# Patient Record
Sex: Male | Born: 1990 | Race: Black or African American | Hispanic: No | Marital: Married | Smoking: Never smoker
Health system: Southern US, Community
[De-identification: ages and names within clinical notes are randomized; demographics above are authoritative.]

## PROBLEM LIST (undated history)

## (undated) DIAGNOSIS — R011 Cardiac murmur, unspecified: Secondary | ICD-10-CM

---

## 2009-09-26 ENCOUNTER — Emergency Department (HOSPITAL_COMMUNITY): Admission: EM | Admit: 2009-09-26 | Discharge: 2009-09-26 | Payer: Self-pay | Admitting: Emergency Medicine

## 2011-10-15 ENCOUNTER — Emergency Department (HOSPITAL_COMMUNITY)
Admission: EM | Admit: 2011-10-15 | Discharge: 2011-10-15 | Disposition: A | Discharge status: Home / Self Care | Attending: Emergency Medicine | Admitting: Emergency Medicine

## 2011-10-15 ENCOUNTER — Encounter: Payer: Self-pay | Admitting: Emergency Medicine

## 2011-10-15 DIAGNOSIS — Z79899 Other long term (current) drug therapy: Secondary | ICD-10-CM | POA: Insufficient documentation

## 2011-10-15 DIAGNOSIS — R51 Headache: Secondary | ICD-10-CM

## 2011-10-15 DIAGNOSIS — H53149 Visual discomfort, unspecified: Secondary | ICD-10-CM | POA: Insufficient documentation

## 2011-10-15 DIAGNOSIS — R42 Dizziness and giddiness: Secondary | ICD-10-CM | POA: Insufficient documentation

## 2011-10-15 MED ORDER — METOCLOPRAMIDE HCL 5 MG/ML IJ SOLN
10.0000 mg | Freq: Once | INTRAMUSCULAR | Status: AC
  Administered 2011-10-15: 10 mg via INTRAVENOUS
  Filled 2011-10-15: qty 2

## 2011-10-15 MED ORDER — IBUPROFEN 800 MG PO TABS: 800.0000 mg | ORAL_TABLET | Freq: Three times a day (TID) | ORAL | Status: AC

## 2011-10-15 NOTE — ED Provider Notes (Signed)
Evaluation and management procedures were performed by the PA/NP under my supervision/collaboration.   Rakeisha Nyce, MD 10/15/11 1526 

## 2011-10-15 NOTE — ED Notes (Signed)
Pt here c/o generalized HA x 1 week; pt sts constipation on Monday and Tuesday and diarrhea the rest of the days; pt sts some dizziness with position change but denies N/V

## 2011-10-15 NOTE — ED Provider Notes (Signed)
History     CSN: [id number] Arrival date & time: 10/15/2011 11:01 AM   First MD Initiated Contact with Patient 10/15/11 1158      Chief Complaint  Patient presents with  . Headache    (Consider location/radiation/quality/duration/timing/severity/associated sxs/prior treatment) HPI History provided by pt.   Pt presents w/ waxing/waning, frontal headache since Monday.  Characteristic of pain change throughout day.  Associated w/ photophobia at night, subjective fever and sometimes  w/ dizziness and loss of balance.  Denies vision changes and N/V.  Denies head trauma.  Had a similar headache when he was in high school.   History reviewed. No pertinent past medical history.  History reviewed. No pertinent past surgical history.  History reviewed. No pertinent family history.  History  Substance Use Topics  . Smoking status: Never Smoker   . Smokeless tobacco: Not on file  . Alcohol Use: No      Review of Systems  All other systems reviewed and are negative.    Allergies  Review of patient's allergies indicates no known allergies.  Home Medications   Current Outpatient Rx  Name Route Sig Dispense Refill  . BISMUTH SUBSALICYLATE 262 MG PO CHEW Oral Chew 524 mg by mouth daily as needed. For upset stomach     . LORATADINE 10 MG PO TABS Oral Take 10 mg by mouth daily.      . MOMETASONE FUROATE 50 MCG/ACT NA SUSP Nasal Place 1 spray into the nose daily.      Carma Leaven M PLUS PO TABS Oral Take 1 tablet by mouth daily.        BP 115/78  Pulse 56  Temp(Src) 98.1 F (36.7 C) (Oral)  Resp 16  SpO2 97%  Physical Exam  Nursing note and vitals reviewed. Constitutional: He is oriented to person, place, and time. He appears well-developed and well-nourished. No distress.  HENT:  Head: Normocephalic and atraumatic.       No sinus tenderness  Eyes:       Normal appearance  Neck: Normal range of motion.       No meningeal signs  Cardiovascular: Normal rate, regular rhythm  and intact distal pulses.   Pulmonary/Chest: Effort normal and breath sounds normal.  Musculoskeletal: Normal range of motion.  Neurological: He is alert and oriented to person, place, and time. He has normal reflexes. He displays no tremor. No cranial nerve deficit or sensory deficit. He displays a negative Romberg sign. Coordination and gait normal.       Nml sensation.  5/5 and equal upper and lower extremity strength.  No past pointing.    Skin: Skin is warm and dry. No rash noted.  Psychiatric: He has a normal mood and affect. His behavior is normal.    ED Course  Procedures (including critical care time)  Labs Reviewed - No data to display No results found.   1. Headache       MDM  Healthy Irena Reichmann M pt presents w/ non-traumatic headache x 4-5 days.  No fever, meningeal signs or focal neuro deficits on exam.  Pt does not appear uncomfortable.  Receiving IV fluids now and IV reglan has been ordered.  Will reassess shortly.  12:36 PM   Pt reports that pain has improved from 4/10 to 2/10.  He feels well enough to go home.  Prescribed 800mg  ibuprofen.  Recommended that he buy a thermometer. Strict return precautions discussed.         Otilio Miu, PA 10/15/11  1325 

## 2013-07-15 ENCOUNTER — Emergency Department (HOSPITAL_COMMUNITY)
Admission: EM | Admit: 2013-07-15 | Discharge: 2013-07-15 | Disposition: A | Discharge status: Home / Self Care | Attending: Emergency Medicine | Admitting: Emergency Medicine

## 2013-07-15 DIAGNOSIS — Z23 Encounter for immunization: Secondary | ICD-10-CM | POA: Insufficient documentation

## 2013-07-15 DIAGNOSIS — S01501A Unspecified open wound of lip, initial encounter: Secondary | ICD-10-CM | POA: Insufficient documentation

## 2013-07-15 DIAGNOSIS — Z79899 Other long term (current) drug therapy: Secondary | ICD-10-CM | POA: Insufficient documentation

## 2013-07-15 DIAGNOSIS — Y9351 Activity, roller skating (inline) and skateboarding: Secondary | ICD-10-CM | POA: Insufficient documentation

## 2013-07-15 DIAGNOSIS — S01511A Laceration without foreign body of lip, initial encounter: Secondary | ICD-10-CM

## 2013-07-15 DIAGNOSIS — IMO0002 Reserved for concepts with insufficient information to code with codable children: Secondary | ICD-10-CM | POA: Insufficient documentation

## 2013-07-15 DIAGNOSIS — Y9239 Other specified sports and athletic area as the place of occurrence of the external cause: Secondary | ICD-10-CM | POA: Insufficient documentation

## 2013-07-15 MED ORDER — TETANUS-DIPHTHERIA TOXOIDS TD 5-2 LFU IM INJ
0.5000 mL | INJECTION | Freq: Once | INTRAMUSCULAR | Status: AC
  Administered 2013-07-15: 0.5 mL via INTRAMUSCULAR
  Filled 2013-07-15: qty 0.5

## 2013-07-15 MED ORDER — CYCLOBENZAPRINE HCL 10 MG PO TABS: 10.0000 mg | ORAL_TABLET | Freq: Two times a day (BID) | ORAL | Status: DC | PRN

## 2013-07-15 MED ORDER — IBUPROFEN 800 MG PO TABS: 800.0000 mg | ORAL_TABLET | Freq: Three times a day (TID) | ORAL | Status: DC

## 2013-07-15 NOTE — ED Notes (Signed)
Pt was riding skateboard, and busted Lower lip.

## 2013-07-15 NOTE — ED Notes (Signed)
Pt states he was riding a skate board and fell and busted his lip. Laceration noted to lower lip.

## 2013-07-15 NOTE — ED Provider Notes (Signed)
CSN: [id number]     Arrival date & time 07/15/13  2115 History  This chart was scribed for non-physician practitioner Elpidio Anis, PA-C, working with Vida Roller, MD, by Yevette Edwards, ED Scribe. This patient was seen in room TR06C/TR06C and the patient's care was started at 9:24 PM.   First MD Initiated Contact with Patient 07/15/13 2123     Chief Complaint  Patient presents with  . Lip Laceration    The history is provided by the patient. No language interpreter was used.   HPI Comments: Jason Finley is a 22 y.o. male who presents to the Emergency Department complaining of an acute laceration to his lower lip which occurred four hours ago today when he was in a skateboarding accident. The laceration is bleeding minimally. He denies any loose teeth. The pt reports he has abrasions to his knees and right elbow. He does not recall his last tetanus.   No past medical history on file. No past surgical history on file. No family history on file. History  Substance Use Topics  . Smoking status: Never Smoker   . Smokeless tobacco: Not on file  . Alcohol Use: No    Review of Systems  Constitutional: Negative for fever and chills.  Skin: Positive for wound.  All other systems reviewed and are negative.    Allergies  Review of patient's allergies indicates no known allergies.  Home Medications   Current Outpatient Rx  Name  Route  Sig  Dispense  Refill  . loratadine (CLARITIN) 10 MG tablet   Oral   Take 10 mg by mouth daily.           . mometasone (NASONEX) 50 MCG/ACT nasal spray   Nasal   Place 1 spray into the nose daily.            Triage Vitals: BP 121/67  Pulse 73  Temp(Src) 98.7 F (37.1 C) (Oral)  Resp 20  SpO2 98%  Physical Exam  Nursing note and vitals reviewed. Constitutional: He is oriented to person, place, and time. He appears well-developed and well-nourished. No distress.  HENT:  Head: Normocephalic.  Stable dentition. 1 cm laceration to  lower lip. Moderate gapping. Minimal swelling.   Eyes: EOM are normal.  Neck: Neck supple. No tracheal deviation present.  Cardiovascular: Normal rate.   Pulmonary/Chest: Effort normal. No respiratory distress.  Musculoskeletal: Normal range of motion.  Neurological: He is alert and oriented to person, place, and time.  Skin: Skin is warm and dry.  Psychiatric: He has a normal mood and affect. His behavior is normal.    ED Course  Procedures (including critical care time)  DIAGNOSTIC STUDIES:  Oxygen Saturation is 98% on room air, normal by my interpretation.    COORDINATION OF CARE:  9:32 PM- Discussed treatment plan with patient, and the patient agreed to the plan.   LACERATION REPAIR PROCEDURE NOTE The patient's identification was confirmed and consent was obtained. This procedure was performed by Elpidio Anis, Pa-C, at 9:42 PM. Site: Lower lip Sterile procedures observed Anesthetic used (type and amt):  2% without epi; less than 1 cc Suture type/size: 6.0 Vicryl Length: 1 cm # of Sutures: 3 Technique: simple, interrupted  Complexity: Low Antibx ointment applied Tetanus ordered Site anesthetized, irrigated with NS, explored without evidence of foreign body, wound well approximated, site covered with dry, sterile dressing.  Patient tolerated procedure well without complications. Instructions for care discussed verbally and patient provided with additional written instructions for  homecare and f/u.   Labs Review Labs Reviewed - No data to display Imaging Review No results found.  MDM  No diagnosis found. 1. Lip laceration  Uncomplicated lip injury without dental involvement.   I personally performed the services described in this documentation, which was scribed in my presence. The recorded information has been reviewed and is accurate.      Arnoldo Hooker, PA-C 07/22/13 1640

## 2013-07-23 NOTE — ED Provider Notes (Signed)
Medical screening examination/treatment/procedure(s) were performed by non-physician practitioner and as supervising physician I was immediately available for consultation/collaboration.    Chevie Birkhead D Chaze Hruska, MD 07/23/13 1057 

## 2014-08-03 ENCOUNTER — Emergency Department (HOSPITAL_COMMUNITY)
Admission: EM | Admit: 2014-08-03 | Discharge: 2014-08-03 | Disposition: A | Discharge status: Home / Self Care | Attending: Emergency Medicine | Admitting: Emergency Medicine

## 2014-08-03 ENCOUNTER — Encounter (HOSPITAL_COMMUNITY): Payer: Self-pay | Admitting: Emergency Medicine

## 2014-08-03 ENCOUNTER — Emergency Department (HOSPITAL_COMMUNITY)

## 2014-08-03 DIAGNOSIS — Y9389 Activity, other specified: Secondary | ICD-10-CM | POA: Insufficient documentation

## 2014-08-03 DIAGNOSIS — S61012A Laceration without foreign body of left thumb without damage to nail, initial encounter: Secondary | ICD-10-CM

## 2014-08-03 DIAGNOSIS — W261XXA Contact with sword or dagger, initial encounter: Secondary | ICD-10-CM

## 2014-08-03 DIAGNOSIS — Y9289 Other specified places as the place of occurrence of the external cause: Secondary | ICD-10-CM | POA: Diagnosis not present

## 2014-08-03 DIAGNOSIS — R011 Cardiac murmur, unspecified: Secondary | ICD-10-CM | POA: Diagnosis not present

## 2014-08-03 DIAGNOSIS — S61209A Unspecified open wound of unspecified finger without damage to nail, initial encounter: Secondary | ICD-10-CM | POA: Diagnosis present

## 2014-08-03 DIAGNOSIS — W260XXA Contact with knife, initial encounter: Secondary | ICD-10-CM | POA: Insufficient documentation

## 2014-08-03 HISTORY — DX: Cardiac murmur, unspecified: R01.1

## 2014-08-03 MED ORDER — LIDOCAINE HCL (PF) 1 % IJ SOLN
5.0000 mL | Freq: Once | INTRAMUSCULAR | Status: AC
  Administered 2014-08-03: 5 mL
  Filled 2014-08-03: qty 5

## 2014-08-03 NOTE — ED Provider Notes (Signed)
CSN: [id number]     Arrival date & time 08/03/14  1420 History  This chart was scribed for non-physician practitioner, Raymon Mutton, PA-C working with Hurman Horn, MD, by Jarvis Morgan, ED Scribe. This patient was seen in room TR08C/TR08C and the patient's care was started at 3:01 PM.     Chief Complaint  Patient presents with  . Laceration    The history is provided by the patient. No language interpreter was used.   HPI Comments: Jason Finley is a 23 y.o. male with a h/o heart mumur who presents to the Emergency Department complaining of a laceration to his left thumb that occurred about 1 hour ago. He states that the laceration occurred when he dropped his pocket knife. He reports a lot of bleeding after the laceration occurred. He wrapped the wound with gauze to control the bleeding. He reports associated mild numbness but reports that he is still able to move the finger. He denies any loss of sensation, weakness, lightheadedness or dizziness. He last t-dap was 2 years ago. He reports no pain in his left thumb. He denies any previous injury to the left thumb.   Past Medical History  Diagnosis Date  . Heart murmur    History reviewed. No pertinent past surgical history. History reviewed. No pertinent family history. History  Substance Use Topics  . Smoking status: Never Smoker   . Smokeless tobacco: Not on file  . Alcohol Use: No    Review of Systems  Musculoskeletal: Negative for arthralgias, joint swelling and myalgias.       No loss of sensation to the left thumb  Skin: Positive for wound (left thumb).  Neurological: Positive for numbness (mild; left thumb). Negative for dizziness, weakness and light-headedness.      Allergies  Review of patient's allergies indicates no known allergies.  Home Medications   Prior to Admission medications   Not on File   Triage Vitals: BP 116/65  Pulse 59  Resp 14  Ht  (1.753 m)  Wt 140 lb (63.504 kg)  BMI 20.67 kg/m2   SpO2 100%  Physical Exam  Nursing note and vitals reviewed. Constitutional: He is oriented to person, place, and time. He appears well-developed and well-nourished. No distress.  HENT:  Head: Normocephalic and atraumatic.  Eyes: Conjunctivae and EOM are normal.  Neck: Normal range of motion. Neck supple.  Cardiovascular: Normal rate, regular rhythm and normal heart sounds.   Pulses:      Radial pulses are 2+ on the right side, and 2+ on the left side.  Cap refill < 3 seconds  Pulmonary/Chest: Effort normal and breath sounds normal. No respiratory distress. He has no wheezes. He has no rales.  Musculoskeletal: Normal range of motion. He exhibits no tenderness.  Full flexion, extension, abduction, adduction noted to the left thumb and digits of the left hand without difficulty or ataxia. Negative swelling, erythema, inflammation, red streaks noted.   Full ROM to upper and lower extremities without difficulty noted, negative ataxia noted.  Neurological: He is alert and oriented to person, place, and time. No cranial nerve deficit. He exhibits normal muscle tone. Coordination normal.  Strength 5+/5+ to upper extremities bilaterally with resistance applied, equal distribution noted Equal grip strength  Strength intact to MCP, PIP, DIP joints of left hand Sensation intact with differentiation to sharp and dull touch  Two point discrimination present  Skin: He is not diaphoretic.  1.5 cm laceration noted to the medial aspect of the  left thumb, distal finger pad. Bleeding controlled.     ED Course  Procedures (including critical care time)  DIAGNOSTIC STUDIES: Oxygen Saturation is 100% on RA, normal by my interpretation.    COORDINATION OF CARE: 3:41 PM  LACERATION REPAIR Performed by: Raymon Mutton, PA-C Consent: Verbal consent obtained. Risks and benefits: risks, benefits and alternatives were discussed Patient identity confirmed: provided demographic data Time out performed  prior to procedure Prepped and Draped in normal sterile fashion Wound explored Laceration Location: medial aspect of the left thumb Laceration Length: 1.5cm No Foreign Bodies seen or palpated Anesthesia: local infiltration Local anesthetic: lidocaine 1% without epinephrine Anesthetic total: 2 ml Irrigation method: bulb Amount of cleaning: standard Skin closure: approximate Number of sutures or staples: 2 sutures Steri-strips placed: 2 Technique: single interrupted Patient tolerance: Patient tolerated the procedure well with no immediate complications.   Labs Review Labs Reviewed - No data to display  Imaging Review Dg Finger Thumb Left  08/03/2014   CLINICAL DATA:  Laceration to the distal end of the thumb.  EXAM: LEFT THUMB 2+V  COMPARISON:  None.  FINDINGS: Normal anatomic alignment. No evidence for acute fracture or dislocation. No evidence for radiopaque foreign body.  IMPRESSION: No acute osseous abnormality.   Electronically Signed   By: Annia Belt M.D.   On: 08/03/2014 16:12     EKG Interpretation None      MDM   Final diagnoses:  Laceration of left thumb, initial encounter   Medications  lidocaine (PF) (XYLOCAINE) 1 % injection 5 mL (5 mLs Infiltration Given by Other 08/03/14 1529)   Filed Vitals:   08/03/14 1430 08/03/14 1733  BP: 116/65 118/70  Pulse: 59 59  Temp:  98.5 F (36.9 C)  TempSrc: Oral Oral  Resp: 14 20  Height:  (1.753 m)   Weight: 140 lb (63.504 kg)   SpO2: 100% 100%   I personally performed the services described in this documentation, which was scribed in my presence. The recorded information has been reviewed and is accurate.  Patient presenting to the ED with laceration to the left thumb that occurred approximately one hour prior to arrival. Bleeding controlled. Laceration to the medial aspect of the distal finger pad of the left thumb. Full ROM noted. Sensation intact. Two point discrimination intact. Negative involvement of tendon  and deep tendon. Wound thoroughly cleaned and soaked. Wound repaired with sutures, 2 placed - patient tolerated procedure well. 2 steri-strips placed. Imaging unremarkable for fracture of foreign body. Patient stable, afebrile. Patient not septic appearing. Patient up to date with Tetanus. Discharged patient. Discussed with patient proper wound care. Referred to Urgent Care Center. Discussed with patient proper wound care. Discussed with patient that he needs to get sutures removed within 5-7 days and steri-strip to be removed within 10 days. Discussed with patient wound care of both sutures and steri-strips. Discussed with patient to closely monitor symptoms and if symptoms are to worsen or change to report back to the ED - strict return instructions given.  Patient agreed to plan of care, understood, all questions answered.   Raymon Mutton, PA-C 08/03/14 1825

## 2014-08-03 NOTE — ED Notes (Signed)
Pt reports cutting his thumb with pocket knife .

## 2014-08-03 NOTE — Discharge Instructions (Signed)
Please call your doctor for a followup appointment within 24-48 hours. When you talk to your doctor please let them know that you were seen in the emergency department and have them acquire all of your records so that they can discuss the findings with you and formulate a treatment plan to fully care for your new and ongoing problems. Please call and set-up an appointment with Urgent care center Please apply double gloves when working Please rest and stay hydrated Please wash the wound everyday with warm water and soap, gently massage and pat dry. Please apply bacitracin ointment or neosporin to the wound. Please keep covered at all times and if the covering is to get wet please replace Please continue to monitor symptoms closely and if symptoms are to worsen or change (fever greater than 101, chills, sweating, nausea, vomiting, chest pain, shortness of breathe, difficulty breathing, weakness, numbness, tingling, worsening or changes to pain pattern, swelling, fall, injury, sutures come out of place, discharge, pus drainage, redness, finger is hot to the touch, red streaks running up the finger/hand) please report back to the Emergency Department immediately.   Sterile Tape Wound Care Some cuts and wounds can be closed using sterile tape, also called skin adhesive strips. Skin adhesive strips can be used for shallow (superficial) and simple cuts, wounds, lacerations, and surgical incisions. These strips act in place of stitches to hold the edges of the wound together, allowing for faster healing. Unlike stitches, the adhesive strips do not require needles or anesthetic medicine for placement. The strips will wear off naturally as the wound is healing. It is important to take proper care of your wound at home while it heals.  HOME CARE INSTRUCTIONS  Try to keep the area around your wound clean and dry. Do not allow the adhesive strips to get wet for the first 12 hours.   Do not use any soaps or  ointments on the wound for the first 12 hours.   If a bandage (dressing) has been applied, follow your health care provider's instructions for how often to change the dressing. Keep the dressing dry if one has been applied.   Do not remove the adhesive strips. They will fall off on their own. If they do not, you may remove them gently after 10 days. You should gently wet the strips before removing them. For example, this can be done in the shower.  Do not scratch, pick, or rub the wound area.   Protect the wound from further injury until it is healed.   Protect the wound from sun and tanning bed exposure while it is healing and for several weeks after healing.   Only take over-the-counter or prescription medicines as directed by your health care provider.   Keep all follow-up appointments as directed by your health care provider.  SEEK MEDICAL CARE IF: Your adhesive strips become wet or soaked with blood before the wound has healed. The tape will need to be replaced.  SEEK IMMEDIATE MEDICAL CARE IF:  You have increasing pain in the wound.   You develop a rash after the strips are applied.  Your wound becomes red, swollen, hot, or tender.   You have a red streak that goes away from the wound.   You have pus coming from the wound.   You have increased bleeding from the wound.  You notice a bad smell coming from the wound.   Your wound breaks open. MAKE SURE YOU:  Understand these instructions.  Will watch  your condition.  Will get help right away if you are not doing well or get worse. Document Released: 12/09/2004 Document Revised: 08/22/2013 Document Reviewed: 05/23/2013 [facility] Patient Information 2015 [place], [place]. This information is not intended to replace advice given to you by your health care provider. Make sure you discuss any questions you have with your health care provider.  Sutured Wound Care Sutures are stitches that can be used to close  wounds. Wound care helps prevent pain and infection.  HOME CARE INSTRUCTIONS   Rest and elevate the injured area until all the pain and swelling are gone.  Only take over-the-counter or prescription medicines for pain, discomfort, or fever as directed by your caregiver.  After 48 hours, gently wash the area with mild soap and water once a day, or as directed. Rinse off the soap. Pat the area dry with a clean towel. Do not rub the wound. This may cause bleeding.  Follow your caregiver's instructions for how often to change the bandage (dressing). Stop using a dressing after 2 days or after the wound stops draining.  If the dressing sticks, moisten it with soapy water and gently remove it.  Apply ointment on the wound as directed.  Avoid stretching a sutured wound.  Drink enough fluids to keep your urine clear or pale yellow.  Follow up with your caregiver for suture removal as directed.  Use sunscreen on your wound for the next 3 to 6 months so the scar will not darken. SEEK IMMEDIATE MEDICAL CARE IF:   Your wound becomes red, swollen, hot, or tender.  You have increasing pain in the wound.  You have a red streak that extends from the wound.  There is pus coming from the wound.  You have a fever.  You have shaking chills.  There is a bad smell coming from the wound.  You have persistent bleeding from the wound. MAKE SURE YOU:   Understand these instructions.  Will watch your condition.  Will get help right away if you are not doing well or get worse. Document Released: 12/09/2004 Document Revised: 01/24/2012 Document Reviewed: 03/07/2011 [facility] Patient Information 2015 [place], [place]. This information is not intended to replace advice given to you by your health care provider. Make sure you discuss any questions you have with your health care provider. Laceration Care, Adult A laceration is a cut or lesion that goes through all layers of the skin and into the tissue  just beneath the skin. TREATMENT  Some lacerations may not require closure. Some lacerations may not be able to be closed due to an increased risk of infection. It is important to see your caregiver as soon as possible after an injury to minimize the risk of infection and maximize the opportunity for successful closure. If closure is appropriate, pain medicines may be given, if needed. The wound will be cleaned to help prevent infection. Your caregiver will use stitches (sutures), staples, wound glue (adhesive), or skin adhesive strips to repair the laceration. These tools bring the skin edges together to allow for faster healing and a better cosmetic outcome. However, all wounds will heal with a scar. Once the wound has healed, scarring can be minimized by covering the wound with sunscreen during the day for 1 full year. HOME CARE INSTRUCTIONS  For sutures or staples:  Keep the wound clean and dry.  If you were given a bandage (dressing), you should change it at least once a day. Also, change the dressing if it becomes  wet or dirty, or as directed by your caregiver.  Wash the wound with soap and water 2 times a day. Rinse the wound off with water to remove all soap. Pat the wound dry with a clean towel.  After cleaning, apply a thin layer of the antibiotic ointment as recommended by your caregiver. This will help prevent infection and keep the dressing from sticking.  You may shower as usual after the first 24 hours. Do not soak the wound in water until the sutures are removed.  Only take over-the-counter or prescription medicines for pain, discomfort, or fever as directed by your caregiver.  Get your sutures or staples removed as directed by your caregiver. For skin adhesive strips:  Keep the wound clean and dry.  Do not get the skin adhesive strips wet. You may bathe carefully, using caution to keep the wound dry.  If the wound gets wet, pat it dry with a clean towel.  Skin adhesive  strips will fall off on their own. You may trim the strips as the wound heals. Do not remove skin adhesive strips that are still stuck to the wound. They will fall off in time. For wound adhesive:  You may briefly wet your wound in the shower or bath. Do not soak or scrub the wound. Do not swim. Avoid periods of heavy perspiration until the skin adhesive has fallen off on its own. After showering or bathing, gently pat the wound dry with a clean towel.  Do not apply liquid medicine, cream medicine, or ointment medicine to your wound while the skin adhesive is in place. This may loosen the film before your wound is healed.  If a dressing is placed over the wound, be careful not to apply tape directly over the skin adhesive. This may cause the adhesive to be pulled off before the wound is healed.  Avoid prolonged exposure to sunlight or tanning lamps while the skin adhesive is in place. Exposure to ultraviolet light in the first year will darken the scar.  The skin adhesive will usually remain in place for 5 to 10 days, then naturally fall off the skin. Do not pick at the adhesive film. You may need a tetanus shot if:  You cannot remember when you had your last tetanus shot.  You have never had a tetanus shot. If you get a tetanus shot, your arm may swell, get red, and feel warm to the touch. This is common and not a problem. If you need a tetanus shot and you choose not to have one, there is a rare chance of getting tetanus. Sickness from tetanus can be serious. SEEK MEDICAL CARE IF:   You have redness, swelling, or increasing pain in the wound.  You see a red line that goes away from the wound.  You have yellowish-white fluid (pus) coming from the wound.  You have a fever.  You notice a bad smell coming from the wound or dressing.  Your wound breaks open before or after sutures have been removed.  You notice something coming out of the wound such as wood or glass.  Your wound is on  your hand or foot and you cannot move a finger or toe. SEEK IMMEDIATE MEDICAL CARE IF:   Your pain is not controlled with prescribed medicine.  You have severe swelling around the wound causing pain and numbness or a change in color in your arm, hand, leg, or foot.  Your wound splits open and starts bleeding.  You  have worsening numbness, weakness, or loss of function of any joint around or beyond the wound.  You develop painful lumps near the wound or on the skin anywhere on your body. MAKE SURE YOU:   Understand these instructions.  Will watch your condition.  Will get help right away if you are not doing well or get worse. Document Released: 11/01/2005 Document Revised: 01/24/2012 Document Reviewed: 04/27/2011 [facility] Patient Information 2015 [place], [place]. This information is not intended to replace advice given to you by your health care provider. Make sure you discuss any questions you have with your health care provider.   Emergency Department Resource Guide 1) Find a Doctor and Pay Out of Pocket Although you won't have to find out who is covered by your insurance plan, it is a good idea to ask around and get recommendations. You will then need to call the office and see if the doctor you have chosen will accept you as a new patient and what types of options they offer for patients who are self-pay. Some doctors offer discounts or will set up payment plans for their patients who do not have insurance, but you will need to ask so you aren't surprised when you get to your appointment.  2) Contact Your Local Health Department Not all health departments have doctors that can see patients for sick visits, but many do, so it is worth a call to see if yours does. If you don't know where your local health department is, you can check in your phone book. The CDC also has a tool to help you locate your state's health department, and many state websites also have listings of all of their  local health departments.  3) Find a Walk-in Clinic If your illness is not likely to be very severe or complicated, you may want to try a walk in clinic. These are popping up all over the country in pharmacies, drugstores, and shopping centers. They're usually staffed by nurse practitioners or physician assistants that have been trained to treat common illnesses and complaints. They're usually fairly quick and inexpensive. However, if you have serious medical issues or chronic medical problems, these are probably not your best option.  No Primary Care Doctor: - Call Health Connect at  [phone] - they can help you locate a primary care doctor that  accepts your insurance, provides certain services, etc. - Physician Referral Service- [phone]  Chronic Pain Problems: Organization         Address  Phone   Notes  [facility]  [phone] Patients need to be referred by their primary care doctor.   Medication Assistance: Organization         Address  Phone   Notes  [facility] Medication [facility] [address] [place] [place], [place] [address] [phone] --Must be a resident of [facility] -- Must have NO insurance coverage whatsoever (no Medicaid/ Medicare, etc.) -- The pt. MUST have a primary care doctor that directs their care regularly and follows them in the community   [facility]ist  [phone]   [employer]  [phone]    Agencies that provide inexpensive medical care: Organization         Address  Phone   Notes  [facility]  [phone]   [facility]    [phone]   [facility] [address] [place], [place] [address] [phone]   [facility] [address], [place] [phone]   Planned Parenthood    [phone]   [facility]    [phone]   [facility]  [address], [place]  Phone:  [phone], Fax:  [phone] Hours of Operation:  9 am - 6 pm, M-F.  Also accepts Medicaid/Medicare and self-pay.  [facility]  [address], [place] Phone: [phone], Fax: [phone]. Hours of Operation:  8:30 am - 5:30 pm, M-F.  Also accepts Medicaid and self-pay.  [facility] [place] [address], [place] Phone: [phone]   [facility] [address] Natasha Bence [place], [place] [phone], Ext. [phone] Mondays & Thursdays: 7-9 AM.  First 15 patients are seen on a first come, first serve basis.    Medicaid-accepting [facility] Providers:  Organization         Address  Phone   Notes  [facility] [address], [place] [phone] Also accepts self-pay patients.  [facility] [address] Laurell Josephs [place], [place]  [phone]   [facility] [address], [place] [phone]   [facility] [address] [place] [phone]   Renaye Rakers [address], [place]   [phone] Only accepts [place] Access [place] patients after they have their name applied to their card.   Self-Pay (no insurance) in [facility]:  Organization         Address  Phone   Notes  Sickle Cell Patients, [facility] [address] [place], [place] [phone]   [facility] Urgent Care [address] [place], [place] [phone]   [facility] [place]  [address], [place] [phone]   [facility]/Dr. Osei-Bonsu  [address], [place] or [address], [place] [phone] Phone number for both [place] and [place] locations is the same.  Urgent Medical and [facility] [address] [place] [phone]   [facility] [address] [place] or [address]  Dr [phone]   [facility] [address], [place] [phone], phone; [phone], fax Sees patients 1st and 3rd Saturday of every month.  Must not qualify for public or private insurance (i.e. Medicaid, Medicare, [place] Health Choice, Veterans' Benefits)  Household income should be no more than 200% of the poverty level The clinic cannot treat you if you are pregnant or think you are pregnant  Sexually transmitted diseases are not treated at the clinic.    Dental Care: Organization         Address  Phone  Notes  [facility] Department of [facility] [address] [place], [place] [phone] Accepts children up to age 51 who are enrolled in [place] or [place] Health Choice; pregnant women with a Medicaid card; and children who have applied for Medicaid or [place] Health Choice, but were declined, whose parents can pay a reduced fee at time of service.  [facility]  [address], [place] [phone] Accepts children up to age 30 who are enrolled in [place] or [place] Health Choice; pregnant women with a Medicaid card; and children who  have applied for Medicaid or [place] Health Choice, but were declined, whose parents can pay a reduced fee at time of service.  [facility]  [address] [place], [place] [phone] Patients are seen by appointment only. Walk-ins are not accepted. [facility] will see patients 53 years of age and older. Monday - Tuesday (8am-5pm) Most Wednesdays (8:30-5pm) $30 per visit, cash only  [facility] Dental Access PRO[facility]  [address], [facility] [phone] Patients are seen by appointment only. Walk-ins are not accepted. [facility] will see patients 51 years of age and older. One Wednesday Evening (Monthly: Volunteer Based).  $30 per visit, cash only  [employer]  [phone] for  adults; Children under age 34, call Graduate Pediatric Dentistry at [phone]. Children aged 60-14, please call [phone] to request a pediatric application.  Dental services are provided in all areas of dental care including fillings, crowns and bridges, complete and partial dentures, implants, gum treatment, root canals, and extractions. Preventive care is also provided. Treatment is provided to both adults and children. Patients are selected via a lottery and there is often a waiting list.   [facility] [address], [place]  [phone] [email or web address]   [facility] [address] [place], [place] [phone], Ext. [phone] Second and Fourth Thursday of each month, opens at 6:30 AM; Clinic ends at 9 AM.  Patients are seen on a first-come first-served basis, and a limited number are seen during each clinic.   [facility]  [address] Ether Griffins [place], [place] [phone]   Eligibility Requirements You must have lived in [place], [place], or [place] counties for at least the last three months.   You cannot be eligible for state or federal sponsored [place], including [employer], [place], or [employer].   You generally cannot be eligible for healthcare insurance through your employer.    How to apply: Eligibility screenings are held every Tuesday and Wednesday afternoon from 1:00 pm until 4:00 pm. You do not need an appointment for the interview!  [facility] [address], [place], [place] [phone]   [facility] Health Department  [phone]   [facility] Health Department  [phone]   [facility] Health Department  [phone]    Behavioral Health Resources in the Community: Intensive Outpatient Programs Organization         Address  Phone  Notes  [facility] Services [address] [place], [place] [phone]   [facility]  [address], [place], [place] [phone]   [facility] [address], [place], [place]  [phone]   [facility] Mental Health [address],  [place], [place] [phone] or [phone]   Substance Abuse Resources Organization         Address  Phone  Notes  [facility]  [phone]   [facility]  [phone]   [facility]  [phone]   Floydene Flock  [phone]   Residential & Outpatient Substance Abuse Program  [phone]   Psychological Services Organization         Address  Phone  Notes  [facility] Health  [phone]   [facility] Services  [phone]   [facility] Mental Health [address], [place] [phone] or [phone]    Mobile Crisis Teams Organization  Address  Phone  Notes  [facility], Mobile Crisis Care Unit  [phone]   [facility]  [address] [place], [place] [phone]   [facility] [address] [place] [phone]    Self-Help/Support Groups Organization         Address  Phone             Notes  [facility] [place] - variety of support groups  336- I7437963 Call for more information  Narcotics Anonymous (NA), [facility] [address], [employer] [place]  2 meetings at this location   Statistician         Address  Phone  Notes  [facility] [address],    [place]  [phone]   [facility]  [address], [place] [address], [place], [place] [phone]   [facility] [address] [place], [place] [phone] Admissions: 8am-3pm M-F  [facility] [address]    [place], [place] [phone]   [facility] [address] [place], [place], [place] [phone]   [facility] [address],  [place], [place] [phone]   [facility]  - Intensive Outpatient [address] Laurell Josephs [address], [place], [place] [phone]   [facility] ([facility] [address] [place]  [place], [place] [phone] or [phone]   [facility] ([facility]) [address] [place], [place] [phone] Accepts Medicaid  [facility] [address],  [place] [phone] Substance Abuse/Addiction Treatment   [facility] Organization         Address  Phone  Notes  [facility]  [phone]   Angie Fava, PhD [address] Ervin Knack [place], [place]   [phone] or [phone]   [facility] Behavioral   [address] [place], [place] [phone]   [facility] [address], [place], [place] [phone] Insurance/Medicaid/sponsorship through [facility] and Families [address]                                    [place], [place] [phone] Therapy/tele-psych/case  [facility] [address], [place] [phone]    Dr. Lolly Mustache  [phone]   [facility] [place]  [employer] [facility] Dept. 1) [address] [place] 2) [address] [place] 3)  [address] [place] [address], [place] [phone]  [phone]   [facility] Child Abuse Hotline (773)312-3343 or [phone] [phone] (After Hours)

## 2014-08-07 ENCOUNTER — Encounter (HOSPITAL_COMMUNITY): Payer: Self-pay | Admitting: Emergency Medicine

## 2014-08-07 ENCOUNTER — Emergency Department (HOSPITAL_COMMUNITY)
Admission: EM | Admit: 2014-08-07 | Discharge: 2014-08-07 | Disposition: A | Discharge status: Home / Self Care | Attending: Emergency Medicine | Admitting: Emergency Medicine

## 2014-08-07 DIAGNOSIS — Z4802 Encounter for removal of sutures: Secondary | ICD-10-CM | POA: Diagnosis not present

## 2014-08-07 DIAGNOSIS — R011 Cardiac murmur, unspecified: Secondary | ICD-10-CM | POA: Diagnosis not present

## 2014-08-07 NOTE — Discharge Instructions (Signed)
Take tylenol, motrin if you have pain.   Keep the wound clean and dry.   Follow up with your doctor.   Return to ER if you have fever, purulent drainage from the wound, worse swelling.

## 2014-08-07 NOTE — ED Provider Notes (Signed)
CSN: [id number]     Arrival date & time 08/07/14  1523 History   First MD Initiated Contact with Patient 08/07/14 1559     Chief Complaint  Patient presents with  . Suture / Staple Removal     (Consider location/radiation/quality/duration/timing/severity/associated sxs/prior Treatment) The history is provided by the patient.  Jason Finley is a 23 y.o. male here with L thumb laceration. Sutures placed 4 days ago. Laceration by pocket knife. 2 sutures placed. No fever or purulent drainage. Slight swelling, improved now.    Past Medical History  Diagnosis Date  . Heart murmur    History reviewed. No pertinent past surgical history. History reviewed. No pertinent family history. History  Substance Use Topics  . Smoking status: Never Smoker   . Smokeless tobacco: Not on file  . Alcohol Use: No    Review of Systems  Skin: Positive for wound.  All other systems reviewed and are negative.     Allergies  Review of patient's allergies indicates no known allergies.  Home Medications   Prior to Admission medications   Not on File   BP 112/63  Pulse 61  Temp(Src) 98.3 F (36.8 C) (Oral)  Resp 12  Ht  (1.753 m)  Wt 135 lb (61.236 kg)  BMI 19.93 kg/m2  SpO2 97% Physical Exam  Nursing note and vitals reviewed. Constitutional: He is oriented to person, place, and time. He appears well-developed and well-nourished.  HENT:  Head: Normocephalic.  Eyes: Pupils are equal, round, and reactive to light.  Neck: Normal range of motion.  Cardiovascular: Normal rate.   Pulmonary/Chest: Effort normal.  Abdominal: Soft.  Musculoskeletal:  L thumb with small laceration that is healing on ulnar aspect. Sutures in place. Capillary refill nl.   Neurological: He is alert and oriented to person, place, and time.  Skin: Skin is warm and dry.  Psychiatric: He has a normal mood and affect. His behavior is normal. Judgment and thought content normal.    ED Course  Procedures  (including critical care time)  SUTURE REMOVAL Performed by: Silverio Lay, Eliga Arvie  Consent: Verbal consent obtained. Patient identity confirmed: provided demographic data Time out: Immediately prior to procedure a "time out" was called to verify the correct patient, procedure, equipment, support staff and site/side marked as required.  Location details: L thumb  Wound Appearance: clean  Sutures/Staples Removed: 2   Facility: sutures placed in this facility Patient tolerance: Patient tolerated the procedure well with no immediate complications.     Labs Review Labs Reviewed - No data to display  Imaging Review No results found.   EKG Interpretation None      MDM   Final diagnoses:  None    [name] is a 23 y.o. male here with L thumb laceration that is healing well. I removed two sutures and wound remained intact. Stable for d/c.     Richardean Canal, MD 08/07/14 [phone]

## 2014-08-07 NOTE — ED Notes (Signed)
Pt here for suture removal to left thumb, they were placed on Saturday. No complaints.

## 2014-08-14 NOTE — ED Provider Notes (Signed)
Medical screening examination/treatment/procedure(s) were performed by non-physician practitioner and as supervising physician I was immediately available for consultation/collaboration.   EKG Interpretation None       Lainee Lehrman M Khalila Buechner, MD 08/14/14 1330 

## 2015-07-28 IMAGING — CR DG FINGER THUMB 2+V*L*
3 series · 3 of 3 positions shown · non-contrast
Comparison: None.

CLINICAL DATA: Laceration to the distal end of the thumb.

EXAM:
LEFT THUMB 2+V

[x finger obl. left]
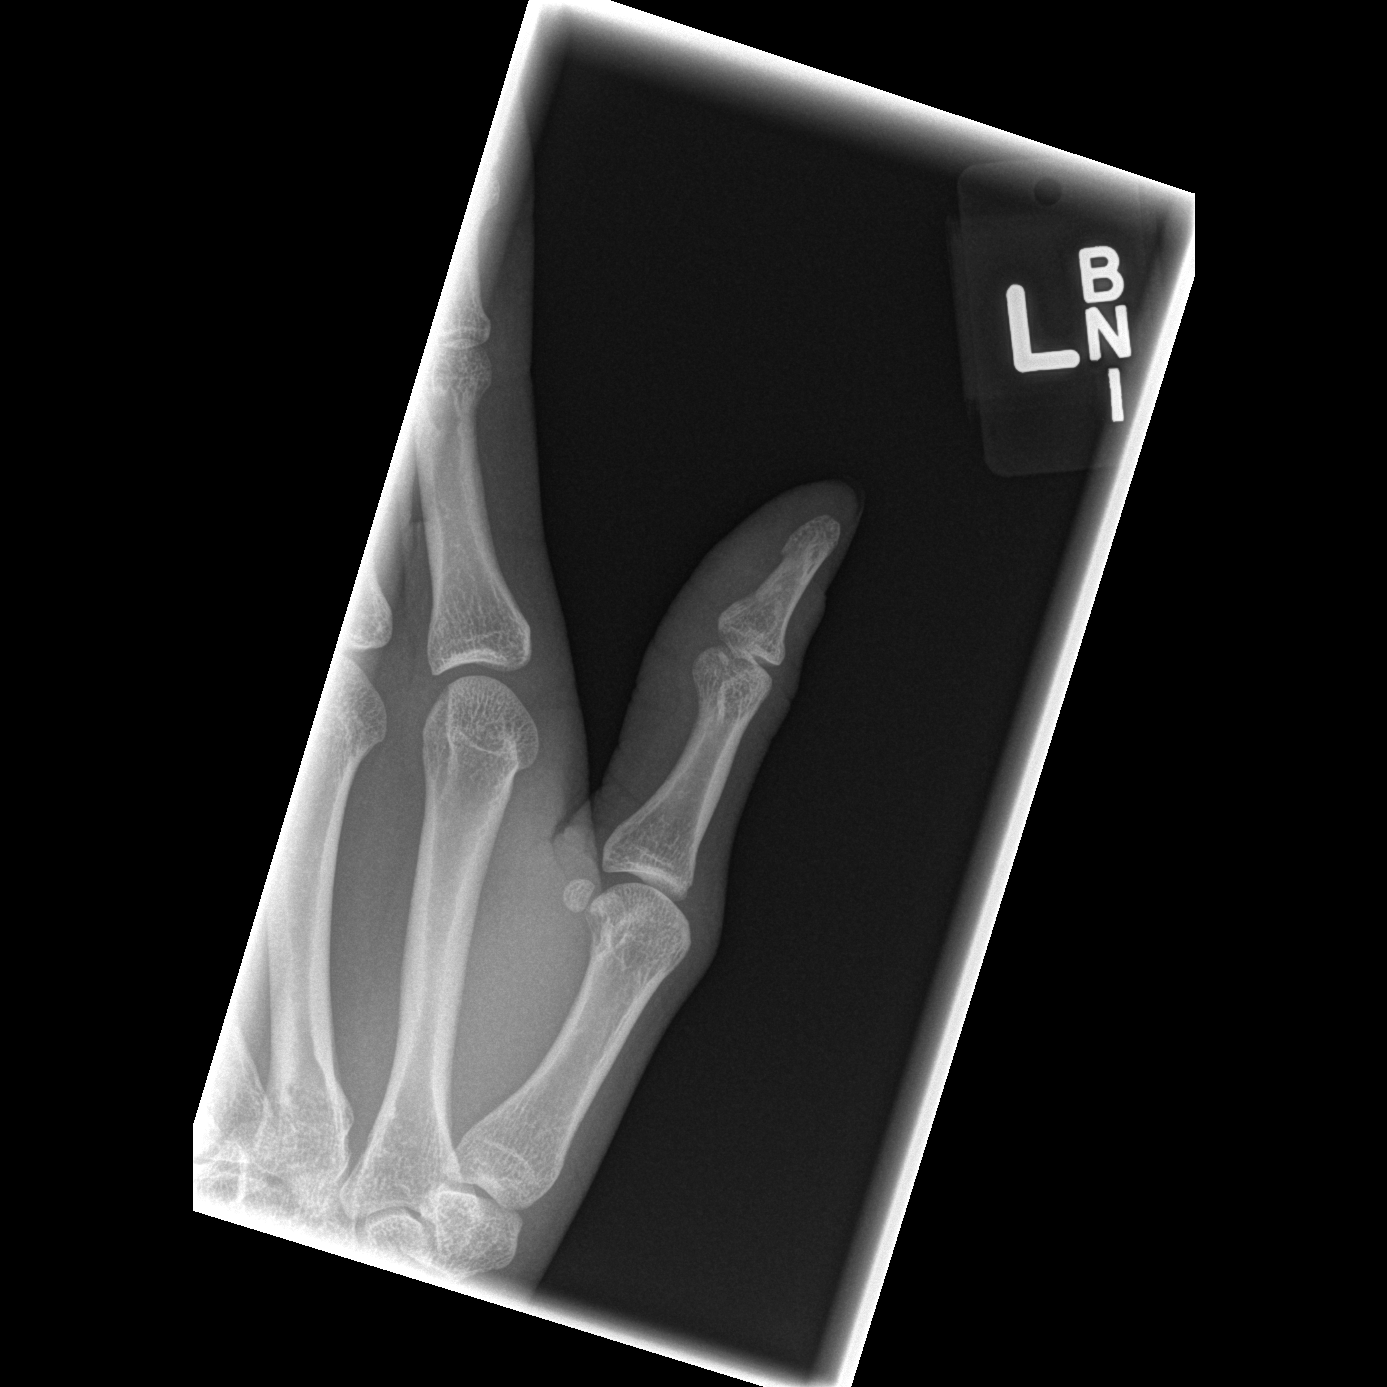

[x finger lateral left]
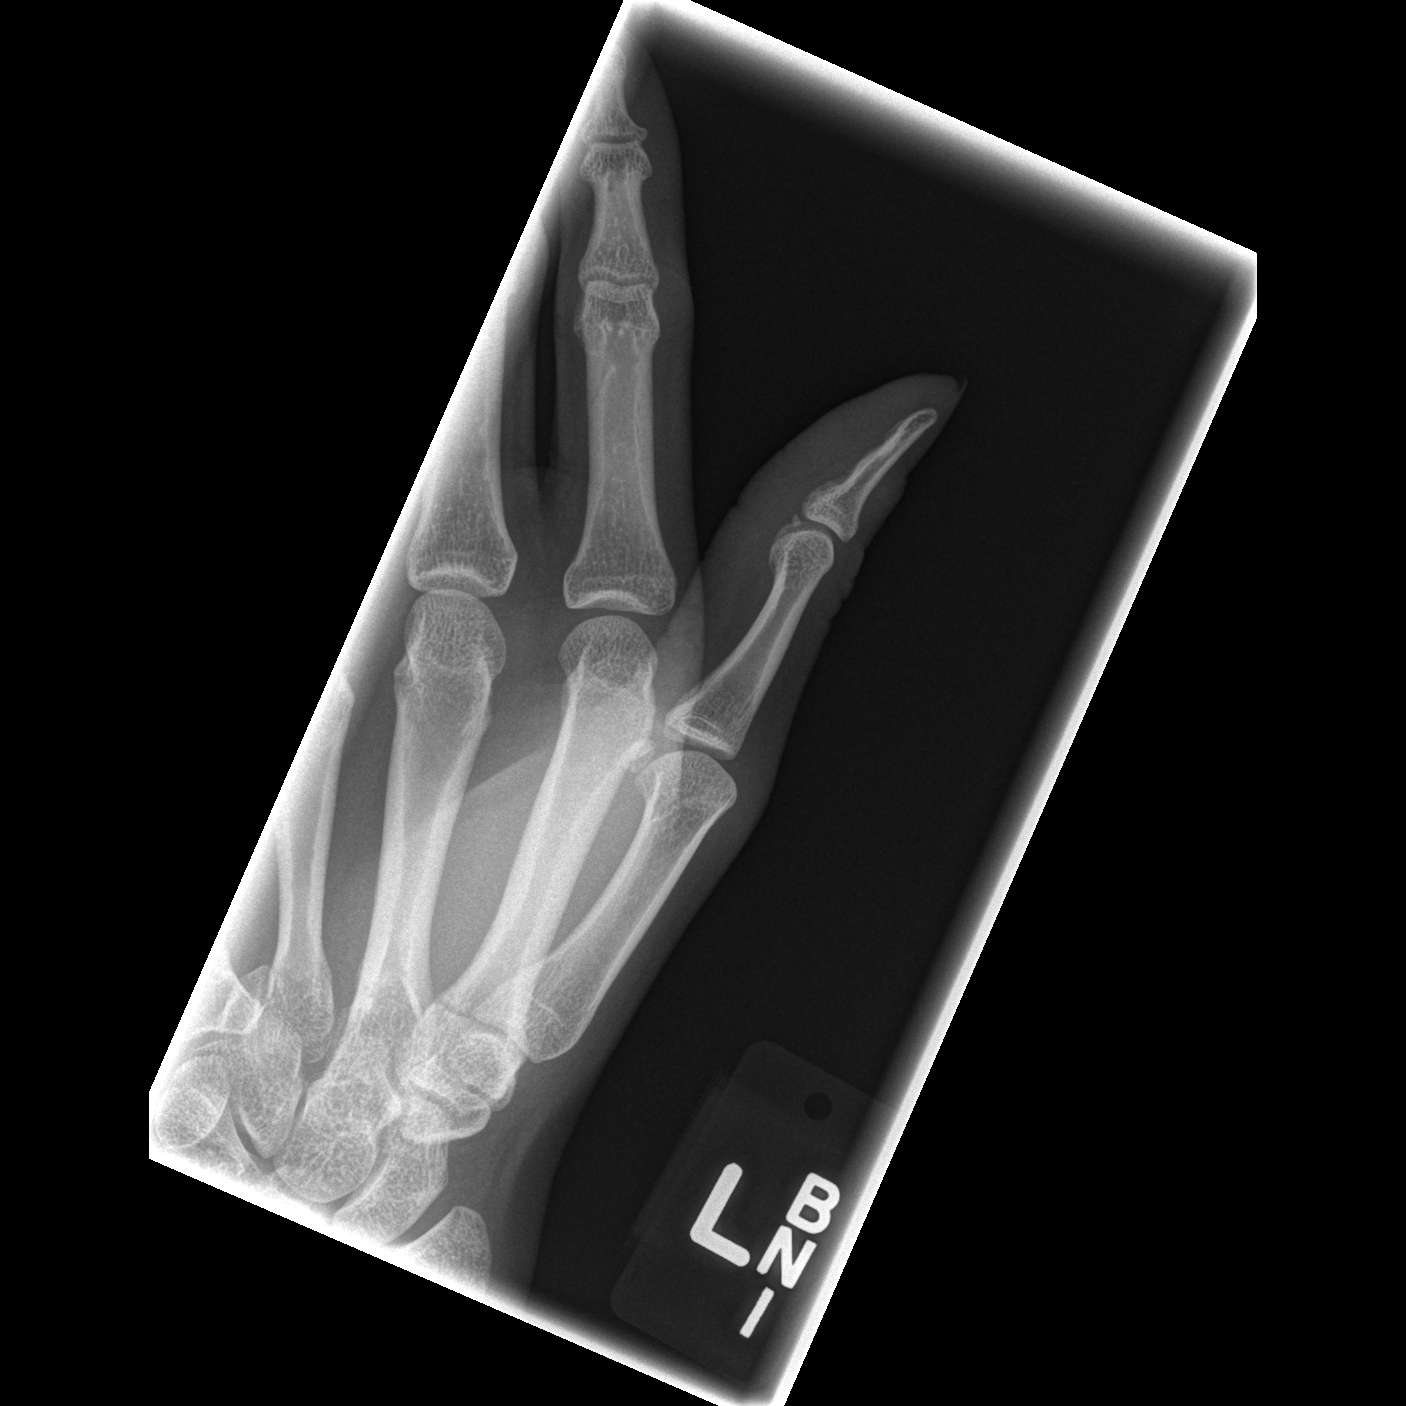

[x finger pa left]
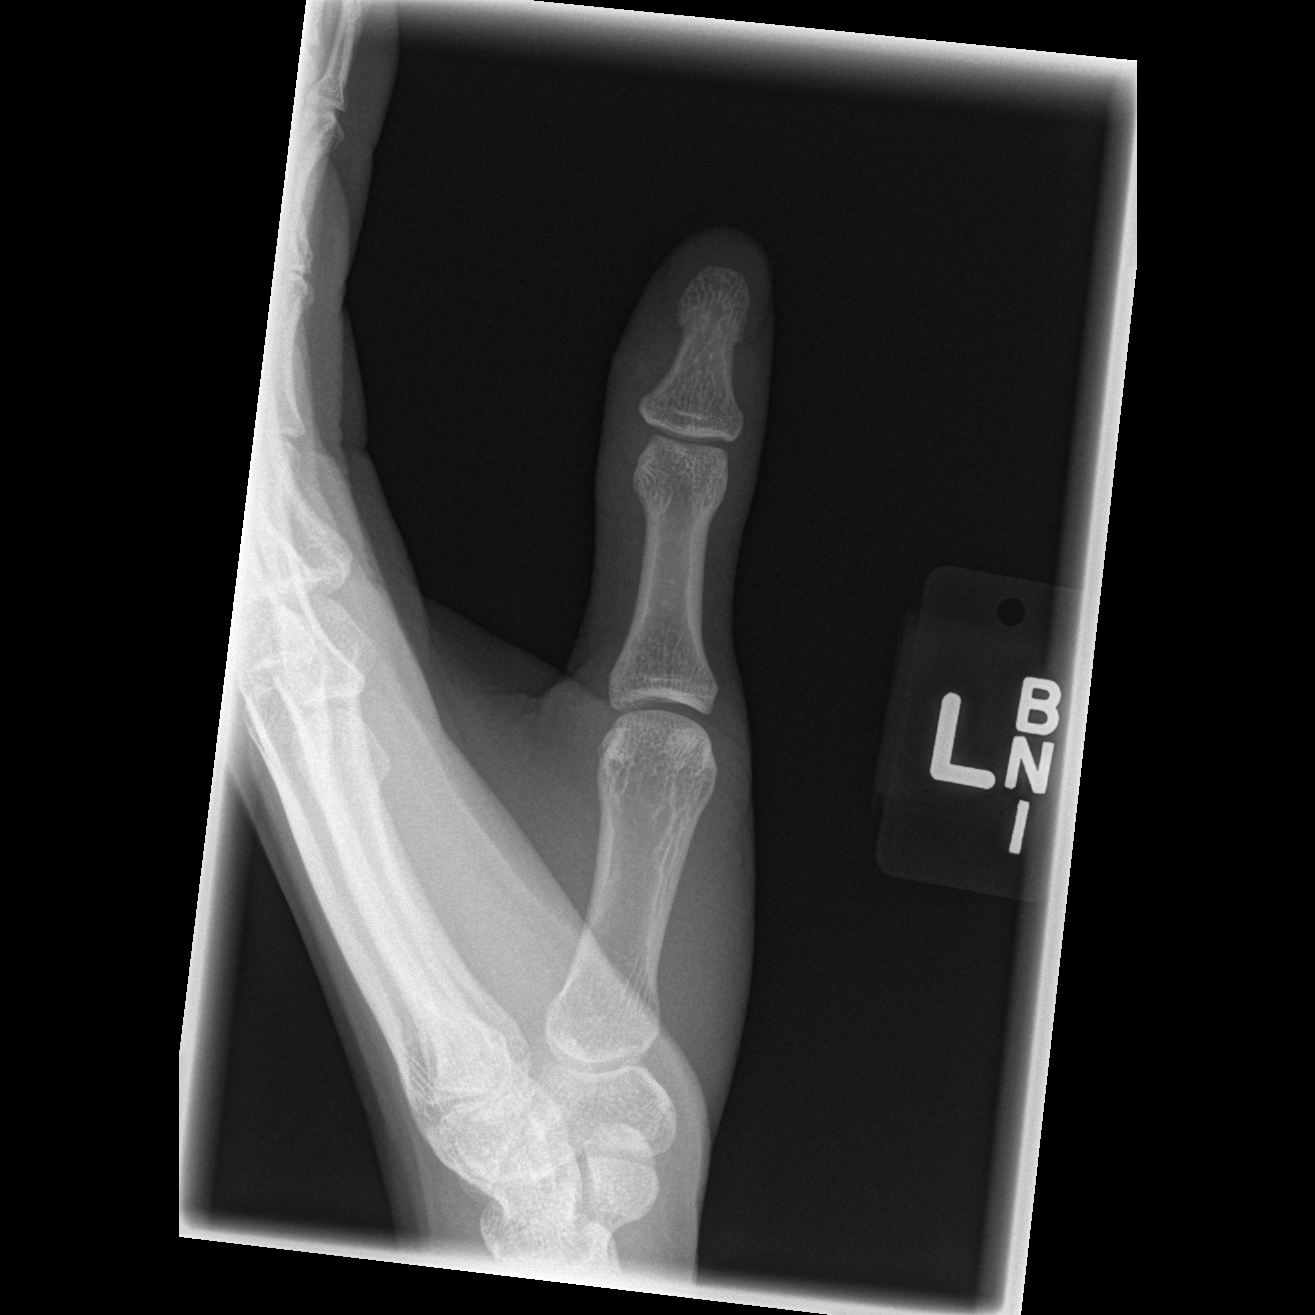

[3 of 3 positions shown; findings below may reference images not displayed]

FINDINGS: Normal anatomic alignment. No evidence for acute fracture or
dislocation. No evidence for radiopaque foreign body.
IMPRESSION: No acute osseous abnormality.
